# Patient Record
Sex: Female | Born: 1990 | Race: Black or African American | Hispanic: No | Marital: Single | Smoking: Never smoker
Health system: Southern US, Community
[De-identification: ages and names within clinical notes are randomized; demographics above are authoritative.]

---

## 2020-04-01 ENCOUNTER — Observation Stay: Payer: Medicaid Other

## 2020-04-01 ENCOUNTER — Observation Stay
Admission: EM | Admit: 2020-04-01 | Discharge: 2020-04-01 | Disposition: A | Discharge status: Home / Self Care | Payer: Medicaid Other | Attending: Obstetrics and Gynecology | Admitting: Obstetrics and Gynecology

## 2020-04-01 ENCOUNTER — Encounter: Payer: Self-pay | Admitting: Obstetrics and Gynecology

## 2020-04-01 ENCOUNTER — Other Ambulatory Visit: Payer: Self-pay

## 2020-04-01 DIAGNOSIS — O99891 Other specified diseases and conditions complicating pregnancy: Secondary | ICD-10-CM | POA: Diagnosis not present

## 2020-04-01 DIAGNOSIS — W03XXXA Other fall on same level due to collision with another person, initial encounter: Secondary | ICD-10-CM

## 2020-04-01 DIAGNOSIS — W010XXA Fall on same level from slipping, tripping and stumbling without subsequent striking against object, initial encounter: Secondary | ICD-10-CM | POA: Diagnosis not present

## 2020-04-01 DIAGNOSIS — O368131 Decreased fetal movements, third trimester, fetus 1: Secondary | ICD-10-CM | POA: Diagnosis not present

## 2020-04-01 DIAGNOSIS — O0932 Supervision of pregnancy with insufficient antenatal care, second trimester: Secondary | ICD-10-CM

## 2020-04-01 DIAGNOSIS — O368120 Decreased fetal movements, second trimester, not applicable or unspecified: Secondary | ICD-10-CM | POA: Diagnosis present

## 2020-04-01 DIAGNOSIS — R109 Unspecified abdominal pain: Secondary | ICD-10-CM | POA: Diagnosis not present

## 2020-04-01 DIAGNOSIS — O26892 Other specified pregnancy related conditions, second trimester: Secondary | ICD-10-CM

## 2020-04-01 DIAGNOSIS — O9A212 Injury, poisoning and certain other consequences of external causes complicating pregnancy, second trimester: Secondary | ICD-10-CM

## 2020-04-01 DIAGNOSIS — O9A213 Injury, poisoning and certain other consequences of external causes complicating pregnancy, third trimester: Principal | ICD-10-CM | POA: Insufficient documentation

## 2020-04-01 DIAGNOSIS — Z3490 Encounter for supervision of normal pregnancy, unspecified, unspecified trimester: Secondary | ICD-10-CM

## 2020-04-01 DIAGNOSIS — Z3A21 21 weeks gestation of pregnancy: Secondary | ICD-10-CM | POA: Diagnosis not present

## 2020-04-01 DIAGNOSIS — S3991XA Unspecified injury of abdomen, initial encounter: Secondary | ICD-10-CM

## 2020-04-01 DIAGNOSIS — W19XXXA Unspecified fall, initial encounter: Secondary | ICD-10-CM | POA: Diagnosis present

## 2020-04-01 LAB — URINE DRUG SCREEN, QUALITATIVE (ARMC ONLY)
Amphetamines, Ur Screen: NOT DETECTED
Barbiturates, Ur Screen: NOT DETECTED
Benzodiazepine, Ur Scrn: NOT DETECTED
Cannabinoid 50 Ng, Ur ~~LOC~~: NOT DETECTED
Cocaine Metabolite,Ur ~~LOC~~: NOT DETECTED
MDMA (Ecstasy)Ur Screen: NOT DETECTED
Methadone Scn, Ur: NOT DETECTED
Opiate, Ur Screen: NOT DETECTED
Phencyclidine (PCP) Ur S: NOT DETECTED
Tricyclic, Ur Screen: NOT DETECTED

## 2020-04-01 LAB — URINALYSIS, ROUTINE W REFLEX MICROSCOPIC
Bacteria, UA: NONE SEEN
Bilirubin Urine: NEGATIVE
Glucose, UA: NEGATIVE mg/dL
Hgb urine dipstick: NEGATIVE
Ketones, ur: NEGATIVE mg/dL
Nitrite: NEGATIVE
Protein, ur: 30 mg/dL — AB
Specific Gravity, Urine: 1.031 — ABNORMAL HIGH (ref 1.005–1.030)
pH: 5 (ref 5.0–8.0)

## 2020-04-01 LAB — DIFFERENTIAL
Abs Immature Granulocytes: 0.05 10*3/uL (ref 0.00–0.07)
Basophils Absolute: 0 10*3/uL (ref 0.0–0.1)
Basophils Relative: 0 %
Eosinophils Absolute: 0.1 10*3/uL (ref 0.0–0.5)
Eosinophils Relative: 1 %
Immature Granulocytes: 1 %
Lymphocytes Relative: 26 %
Lymphs Abs: 2.3 10*3/uL (ref 0.7–4.0)
Monocytes Absolute: 0.6 10*3/uL (ref 0.1–1.0)
Monocytes Relative: 7 %
Neutro Abs: 5.6 10*3/uL (ref 1.7–7.7)
Neutrophils Relative %: 65 %

## 2020-04-01 LAB — CHLAMYDIA/NGC RT PCR (ARMC ONLY)
Chlamydia Tr: NOT DETECTED
N gonorrhoeae: NOT DETECTED

## 2020-04-01 LAB — CBC
HCT: 33.4 % — ABNORMAL LOW (ref 36.0–46.0)
Hemoglobin: 11.3 g/dL — ABNORMAL LOW (ref 12.0–15.0)
MCH: 29.5 pg (ref 26.0–34.0)
MCHC: 33.8 g/dL (ref 30.0–36.0)
MCV: 87.2 fL (ref 80.0–100.0)
Platelets: 170 10*3/uL (ref 150–400)
RBC: 3.83 MIL/uL — ABNORMAL LOW (ref 3.87–5.11)
RDW: 13.3 % (ref 11.5–15.5)
WBC: 8.6 10*3/uL (ref 4.0–10.5)
nRBC: 0 % (ref 0.0–0.2)

## 2020-04-01 LAB — RAPID HIV SCREEN (HIV 1/2 AB+AG)
HIV 1/2 Antibodies: NONREACTIVE
HIV-1 P24 Antigen - HIV24: NONREACTIVE

## 2020-04-01 LAB — TYPE AND SCREEN
ABO/RH(D): O POS
Antibody Screen: NEGATIVE

## 2020-04-01 LAB — ABO/RH: ABO/RH(D): O POS

## 2020-04-01 LAB — HEPATITIS B SURFACE ANTIGEN: Hepatitis B Surface Ag: NONREACTIVE

## 2020-04-01 MED ORDER — ACETAMINOPHEN 325 MG PO TABS: 650.0000 mg | ORAL_TABLET | ORAL | Status: DC | PRN

## 2020-04-01 NOTE — OB Triage Note (Signed)
Pt G6P4 at [redacted]w[redacted]d after falling about 3 hours ago. Pt reports she tripped over her child and hit right side of her belly. Pt reports she hasnt felt baby move much since. Denies LOF/bleeding/CTX. Pt reports mild cramping on right side of her belly. Pt has not established [facility] for this pregnancy. VSS. FHT 155.

## 2020-04-01 NOTE — Discharge Summary (Signed)
See final progress note. 

## 2020-04-01 NOTE — Final Progress Note (Signed)
Final Progress Note  Patient ID: Sylvia Singleton MRN: [id number] DOB/AGE: 1991/02/20 30 y.o.  Admit date: 04/01/2020 Admitting provider: Zipporah Plants, CNM Discharge date: 04/01/2020   Admission Diagnoses:  Patient Active Problem List   Diagnosis Date Noted  . Pregnancy 04/01/2020  . [redacted] weeks gestation of pregnancy 04/01/2020  . No prenatal care in current pregnancy in second trimester 04/01/2020   Decreased fetal movement affecting management of pregnancy in second trimester 04/01/2020  . Fall 04/01/2020     Discharge Diagnoses:  Active Problems:   Pregnancy   [redacted] weeks gestation of pregnancy   No prenatal care in current pregnancy in second trimester    History of Present Illness: The patient is a 30 y.o. female [phone] at [redacted]w[redacted]d who presents for evaluation following fall onto abdomen today. Patient states she tripped over her daughter and fell onto her side/abdomen around three hours prior to presentation in [facility] triage. Patient states that since following she has experienced limited FM. Patient states she has been felling regular FM for the last three weeks. Since presentation to [facility] triage patient has experienced in increase in FM sensation. Patient denies VB, LOF, or ctx at this time. Patient reports she has not received any prenatal care during this pregnancy. She has had one presentation to [facility] for evaluation and her EDD is based on an [place] from that encounter, per patient. Patient states she was treated for a UTI during that visit. Patient additionally states she has been treated for chlamydia during this pregnancy. Patient denies any medical history. Patient states she has had a hx of 4 previous term NSVDs without complication. Patient had been planning on receiving [facility] at [facility] but has yet to schedule a visit.  History reviewed. No pertinent past medical history.  History reviewed. No pertinent surgical history.  No current  facility-administered medications on file prior to encounter.   No current outpatient medications on file prior to encounter.    Not on File  Social History   Socioeconomic History  . Marital status: Single    Spouse name: Not on file  . Number of children: Not on file  . Years of education: Not on file  . Highest education level: Not on file  Occupational History  . Not on file  Tobacco Use  . Smoking status: Never Smoker  . Smokeless tobacco: Never Used  Vaping Use  . Vaping Use: Never used  Substance and Sexual Activity  . Alcohol use: Not Currently  . Drug use: Never  . Sexual activity: Not on file  Other Topics Concern  . Not on file  Social History Narrative  . Not on file   Social Determinants of Health   Financial Resource Strain: Not on file  Food Insecurity: Not on file  Transportation Needs: Not on file  Physical Activity: Not on file  Stress: Not on file  Social Connections: Not on file  Intimate Partner Violence: Not on file    History reviewed. No pertinent family history.   Review of Systems  Constitutional: Negative.   HENT: Negative.   Eyes: Negative.   Respiratory: Negative.   Cardiovascular: Negative.   Gastrointestinal: Negative.  Negative for abdominal pain.       Patient reports mild cramping  Genitourinary: Negative.   Musculoskeletal: Negative.   Skin: Negative.   Neurological: Negative.   Endo/Heme/Allergies: Negative.   Psychiatric/Behavioral: Negative.      Physical Exam: BP 120/76   Pulse 87   Temp (!)  97.5 F (36.4 C) (Oral)   Ht 5\' 4"  (1.626 m)   Wt 90.7 kg   BMI 34.33 kg/m   Physical Exam Constitutional:      Appearance: Normal appearance.  Genitourinary:     Genitourinary Comments: Deferred  HENT:     Head: Normocephalic.  Cardiovascular:     Rate and Rhythm: Normal rate.  Pulmonary:     Effort: Pulmonary effort is normal.  Abdominal:     Palpations: Abdomen is soft.     Tenderness: There is no abdominal  tenderness. There is no guarding.     Comments: Gravid, size consistent with 20-22 wk dates  Musculoskeletal:        General: Normal range of motion.  Neurological:     Mental Status: She is alert and oriented to person, place, and time.  Skin:    General: Skin is warm and dry.  Psychiatric:        Mood and Affect: Mood normal.        Behavior: Behavior normal.  Vitals and nursing note reviewed.     Consults: None  Significant Findings/ Diagnostic Studies:   EXAM: OBSTETRICAL ULTRASOUND >14 WKS  FINDINGS: Number of Fetuses: 1 Heart Rate:  141 bpm Movement: Yes Presentation: Cephalic Previa: No Placental Location: Right lateral Amniotic Fluid (Subjective): Within normal limits Amniotic Fluid (Objective): Vertical pocket = 4.8cm  FETAL BIOMETRY BPD: 5.5cm 22w 5d HC:   20.2cm 22w 2d AC:   17.3cm 22w 2d FL:   4.2cm 23w 5d Current Mean GA: 22w 4d EDC: 08/01/2020 Assigned GA:  21w 6d Assigned EDC: 08/06/2020  FETAL ANATOMY Lateral Ventricles: Appears normal Thalami/CSP: Appears normal Posterior Fossa:  Appears normal Nuchal Region: Appears normal   NFT= N/A > 20 WKS Upper Lip: Appears normal Spine: Appears normal 4 Chamber Heart on Left: Appears normal LVOT: Appears normal RVOT: Appears normal Stomach on Left: Appears normal 3 Vessel Cord: Appears normal Cord Insertion site: Appears normal Kidneys: Appears normal Bladder: Appears normal Extremities: Appears normal Sex: Female Maternal Findings: Cervix:  3.7 cm TA  IMPRESSION: Assigned GA currently 21 weeks 6 days.  Appropriate fetal growth. Unremarkable fetal anatomic survey.  No fetal anomalies identified. No evidence of placental abruption or other acute maternal findings.   Procedures: None  Hospital Course: The patient was admitted to Labor and Delivery Triage for observation. Fetal-well being was assessed with +FHT and 08/08/2020 wnl. Patient states she is now experiencing fetal movement. OB labs  were obtained due to no prenatal care. Patient encouraged to establish prenatal care site as soon as possible. Discharged home in stable condition.  Discharge Condition: good  Disposition: Discharge disposition: 01-Home or Self Care       Diet: Regular diet  Discharge Activity: Activity as tolerated   Allergies as of 04/01/2020   Not on File     Medication List    You have not been prescribed any medications.      Total time spent taking care of this patient: 30 minutes  Signed:  05/30/2020, CNM 04/01/2020, 3:51 PM

## 2020-04-02 LAB — RPR: RPR Ser Ql: NONREACTIVE

## 2020-04-02 LAB — RUBELLA SCREEN: Rubella: 7.18 index (ref >0.99)

## 2020-04-03 LAB — URINE CULTURE

## 2020-04-04 ENCOUNTER — Other Ambulatory Visit: Payer: Self-pay | Admitting: Obstetrics and Gynecology

## 2020-04-04 DIAGNOSIS — O2342 Unspecified infection of urinary tract in pregnancy, second trimester: Secondary | ICD-10-CM

## 2020-04-04 MED ORDER — NITROFURANTOIN MONOHYD MACRO 100 MG PO CAPS: 100.0000 mg | ORAL_CAPSULE | Freq: Two times a day (BID) | ORAL | 1 refills | Status: AC

## 2022-09-06 IMAGING — US US OB COMP +14 WK
1 series · 13 of 28 positions shown · non-contrast
Comparison: none

CLINICAL DATA: Fall. Abdominal pain. No prenatal care. Assigned
gestational age of 21 weeks 6 days.

EXAM:
OBSTETRICAL ULTRASOUND >14 WKS

[Series 1: us ob comp + 14 wk · 13 of 79 slices shown]
[im 3/79]
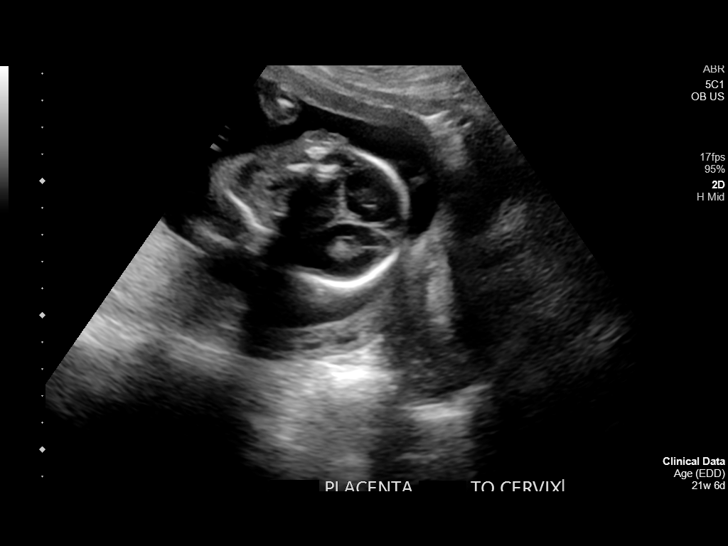
[im 9/79]
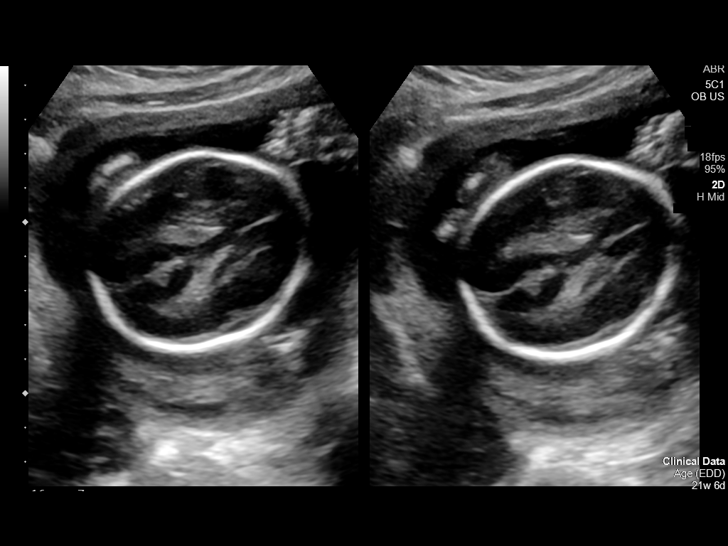
[im 15/79]
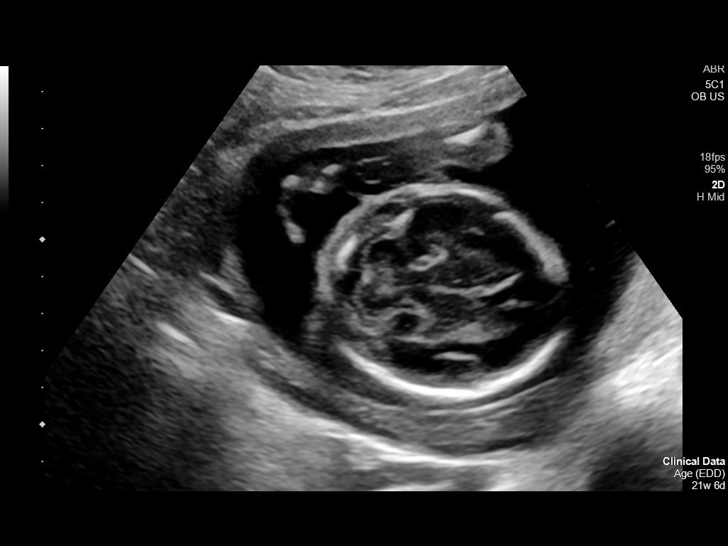
[im 21/79]
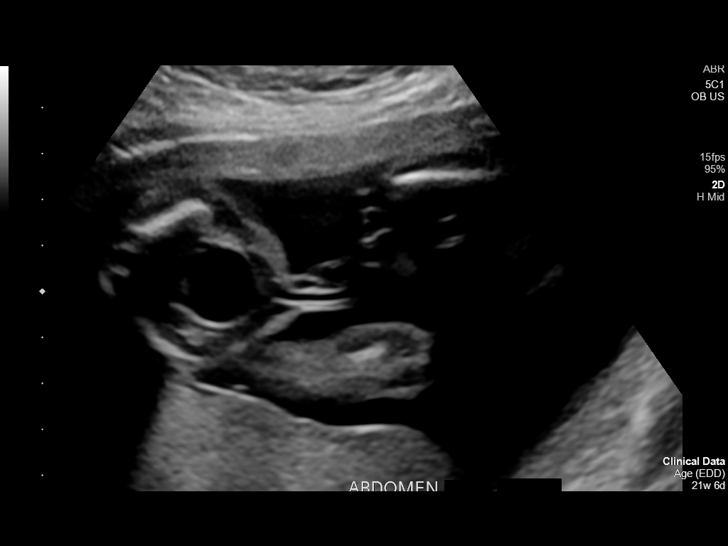
[im 27/79]
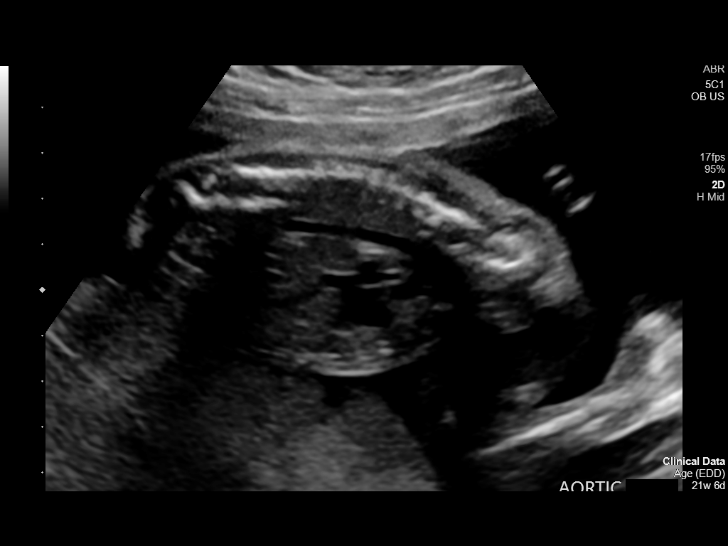
[im 32/79]
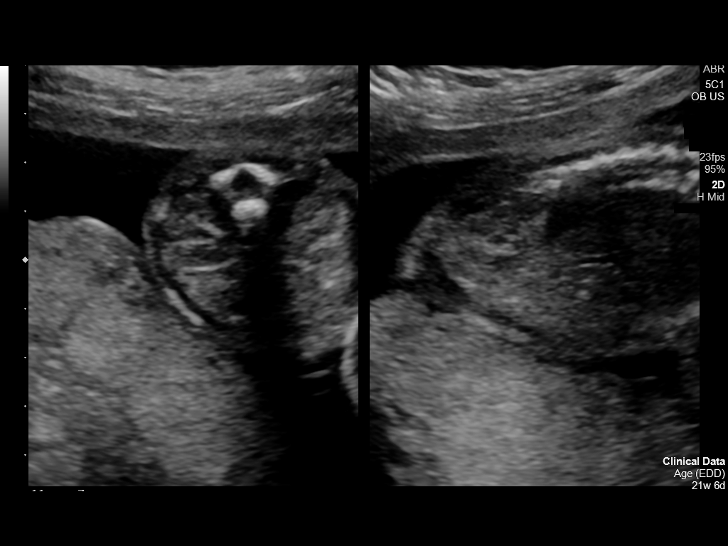
[im 41/79]
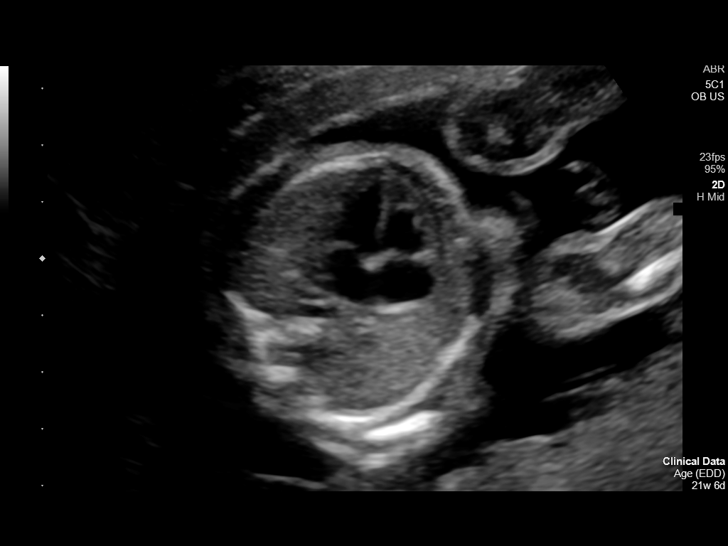
[im 47/79]
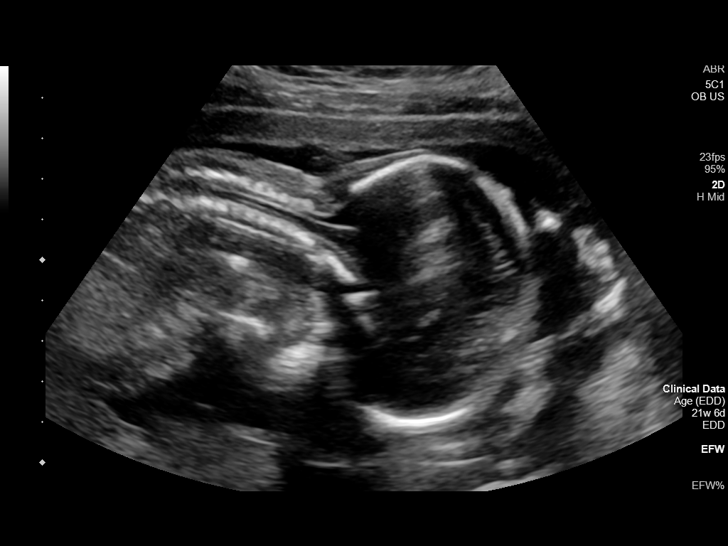
[im 53/79]
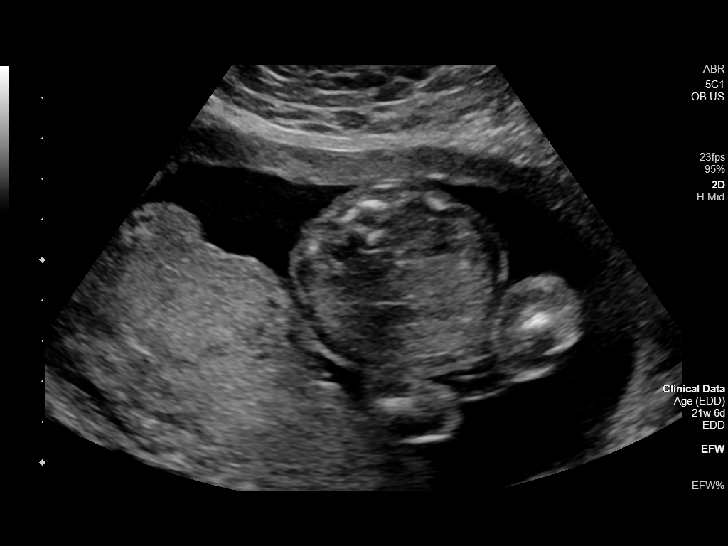
[im 58/79]
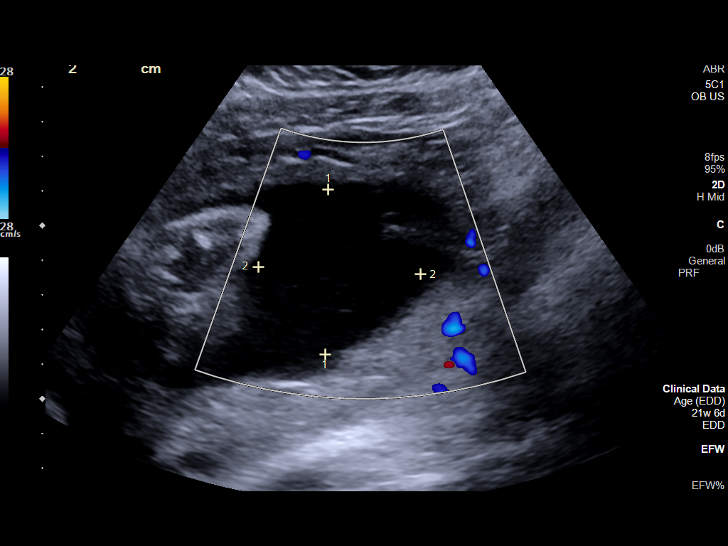
[im 64/79]
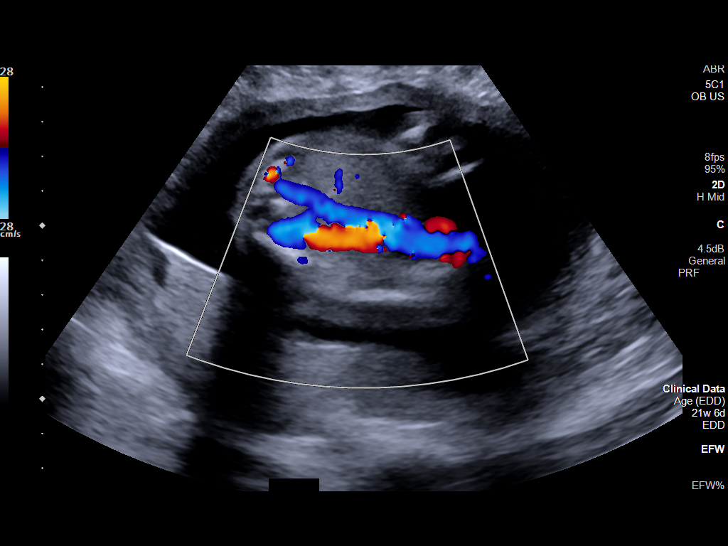
[im 70/79]
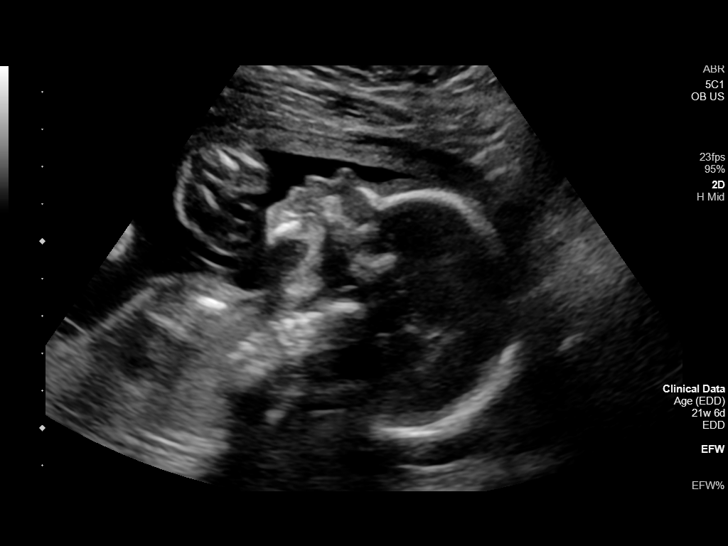
[im 76/79]
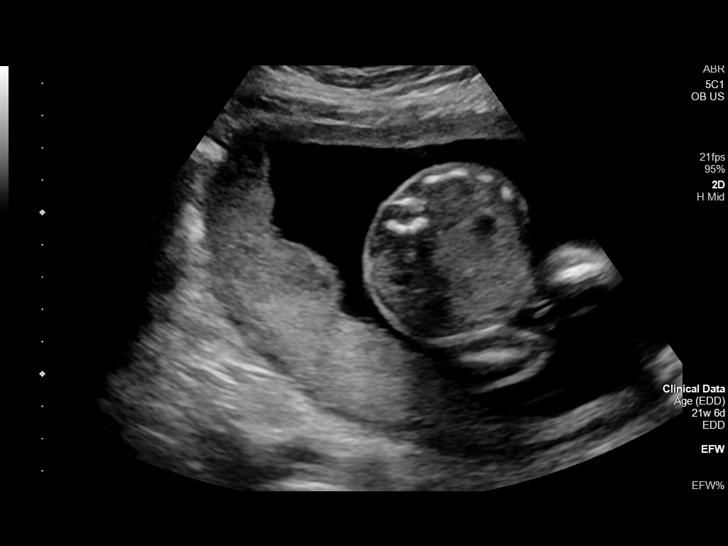

[13 of 28 positions shown; findings below may reference images not displayed]

FINDINGS: Number of Fetuses: 1

Heart Rate:  141 bpm

Movement: Yes

Presentation: Cephalic

Previa: No

Placental Location: Right lateral

Amniotic Fluid (Subjective): Within normal limits

Amniotic Fluid (Objective):

Vertical pocket = 4.8cm

FETAL BIOMETRY

BPD: 5.5cm 22w 5d

HC:   20.2cm 22w 2d

AC:   17.3cm 22w 2d

FL:   4.2cm 23w 5d

Current Mean GA: 22w 4d US EDC: 08/01/2020

Assigned GA:  21w 6d Assigned EDC: 08/06/2020

FETAL ANATOMY

Lateral Ventricles: Appears normal

Thalami/CSP: Appears normal

Posterior Fossa:  Appears normal

Nuchal Region: Appears normal   NFT= N/A > 20 WKS

Upper Lip: Appears normal

Spine: Appears normal

4 Chamber Heart on Left: Appears normal

LVOT: Appears normal

RVOT: Appears normal

Stomach on Left: Appears normal

3 Vessel Cord: Appears normal

Cord Insertion site: Appears normal

Kidneys: Appears normal

Bladder: Appears normal

Extremities: Appears normal

Maternal Findings:

Cervix:  3.7 cm TA
IMPRESSION: Assigned GA currently 21 weeks 6 days.  Appropriate fetal growth.

Unremarkable fetal anatomic survey.  No fetal anomalies identified.

No evidence of placental abruption or other acute maternal findings.
